# Patient Record
Sex: Male | Born: 1950 | Race: White | Marital: Single
Health system: Southern US, Community
[De-identification: ages and names within clinical notes are randomized; demographics above are authoritative.]

---

## 2021-10-15 ENCOUNTER — Encounter (INDEPENDENT_AMBULATORY_CARE_PROVIDER_SITE_OTHER): Payer: Self-pay

## 2021-10-16 NOTE — Telephone Encounter (Signed)
This encounter was created in error - please disregard.    Items noted as "reviewed" are for administrative purposes only and are not guaranteed by the provider to be accurate on this date.

## 2021-12-01 IMAGING — CT CT CHEST/ABD/PELVIS W CON
2 of 7 series · 11 of 46 positions shown, 12 images · non-contrast
Comparison: none

[Series 6: venous · axial · portal-venous · 0.62mm/px · z∈[-1616,-1037]mm · 8 of 233 slices shown, 9 images]
[im 20/233  soft-tissue]
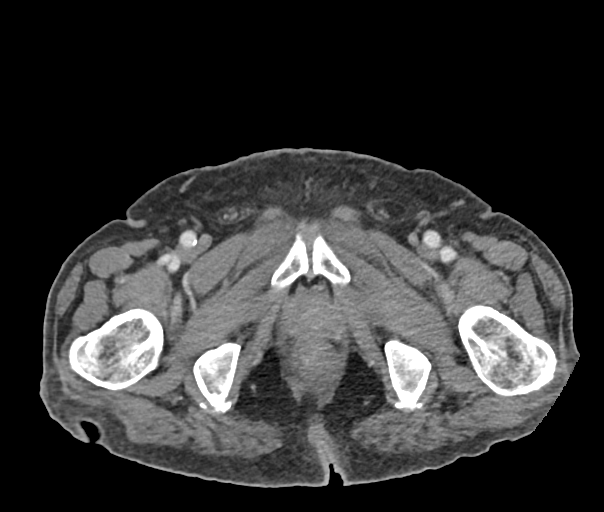
[im 20/233  bone]
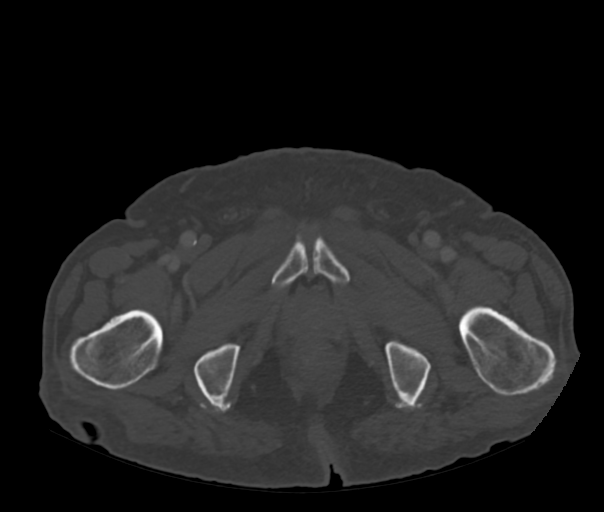
[im 59/233  soft-tissue]
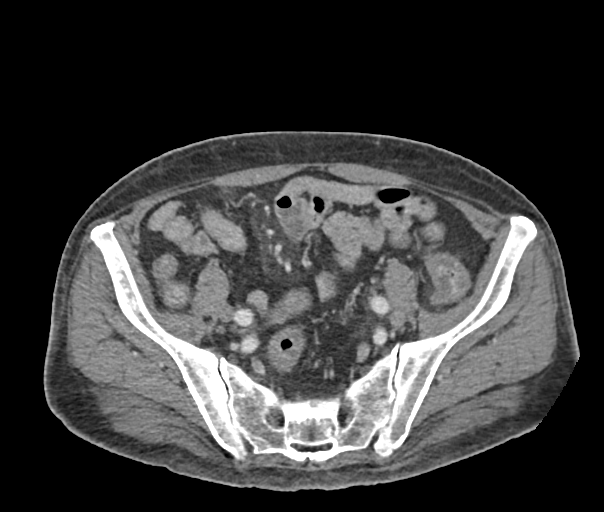
[im 78/233  soft-tissue]
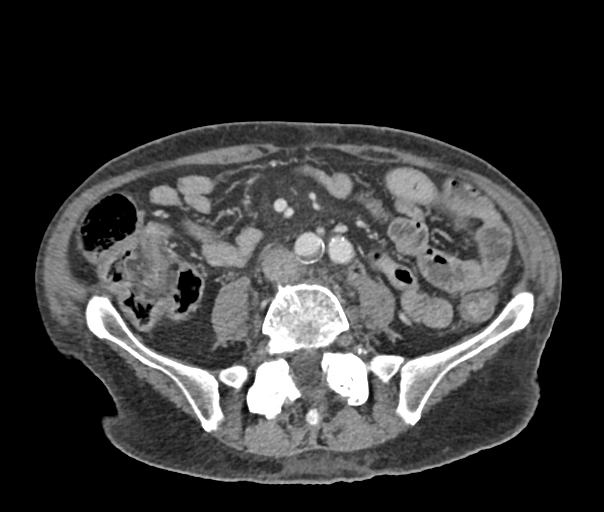
[im 97/233  soft-tissue]
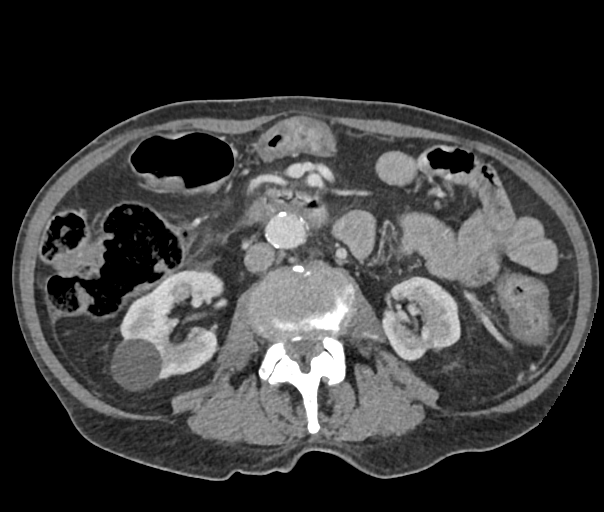
[im 136/233  soft-tissue]
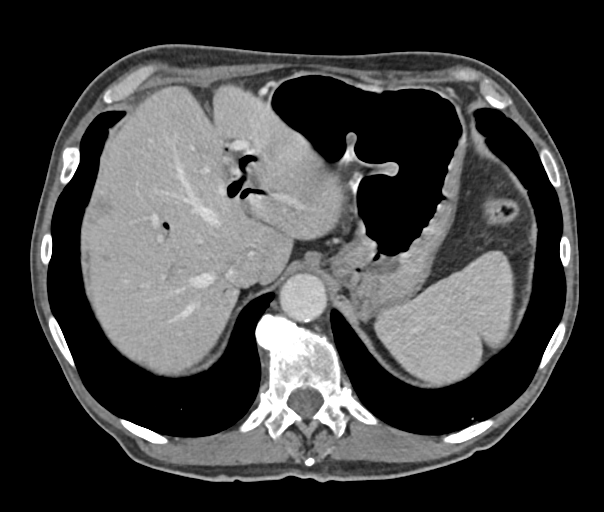
[im 155/233  soft-tissue]
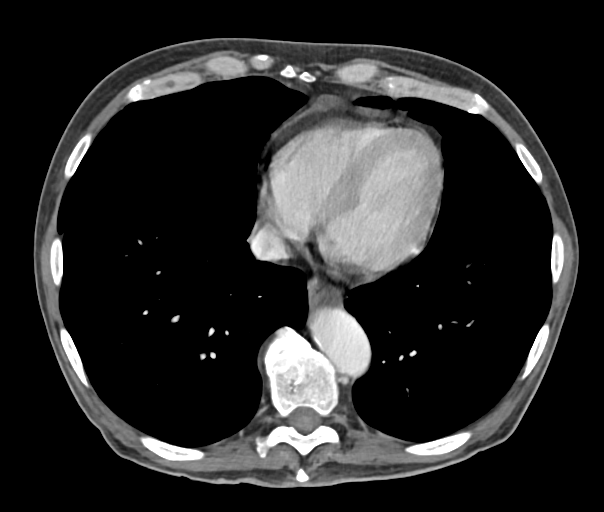
[im 175/233  soft-tissue]
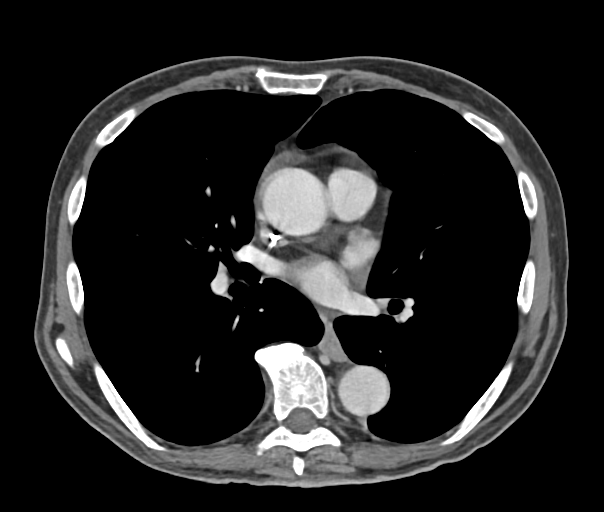
[im 213/233  soft-tissue]
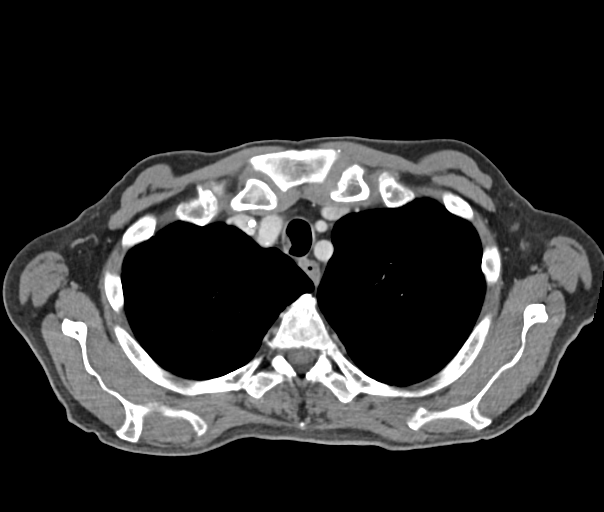

[Series 8: coronal · coronal · 0.73mm/px · 3 of 92 slices shown]
[im 31/92  soft-tissue]
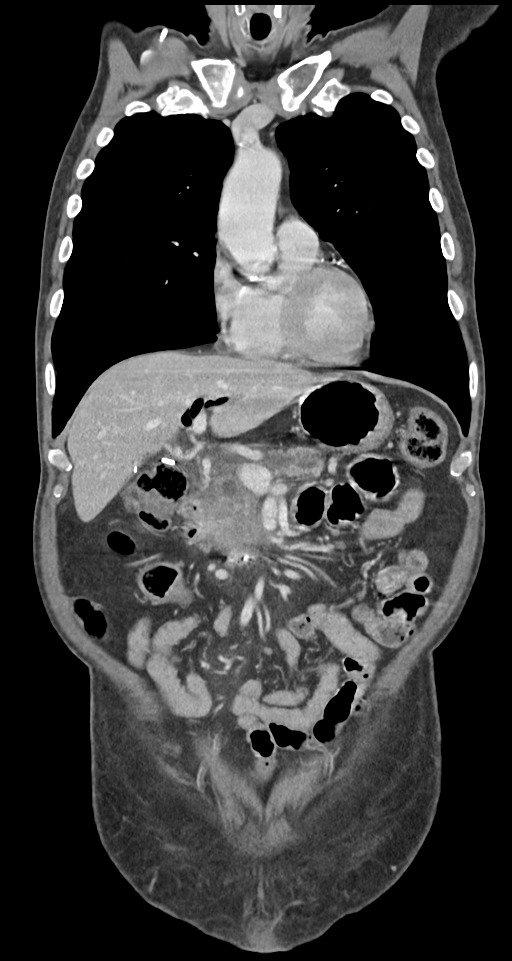
[im 41/92  soft-tissue]
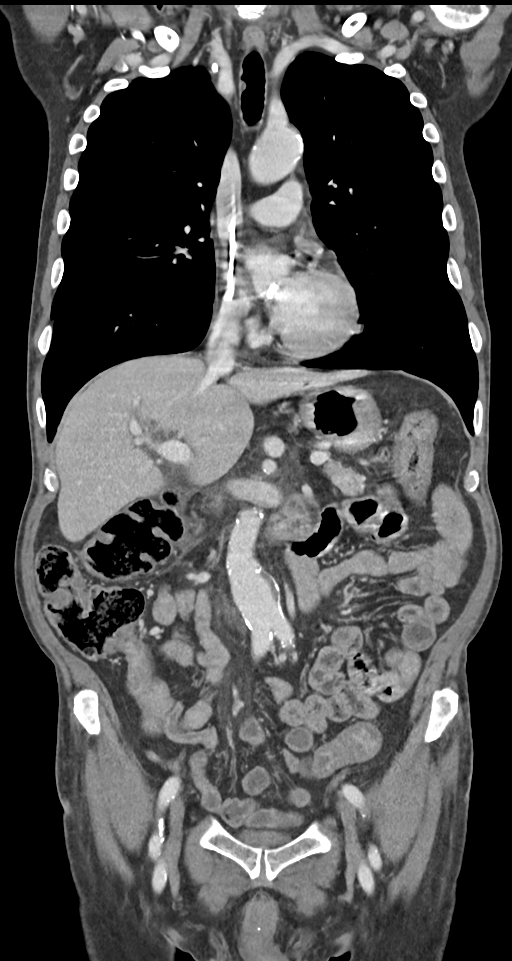
[im 51/92  soft-tissue]
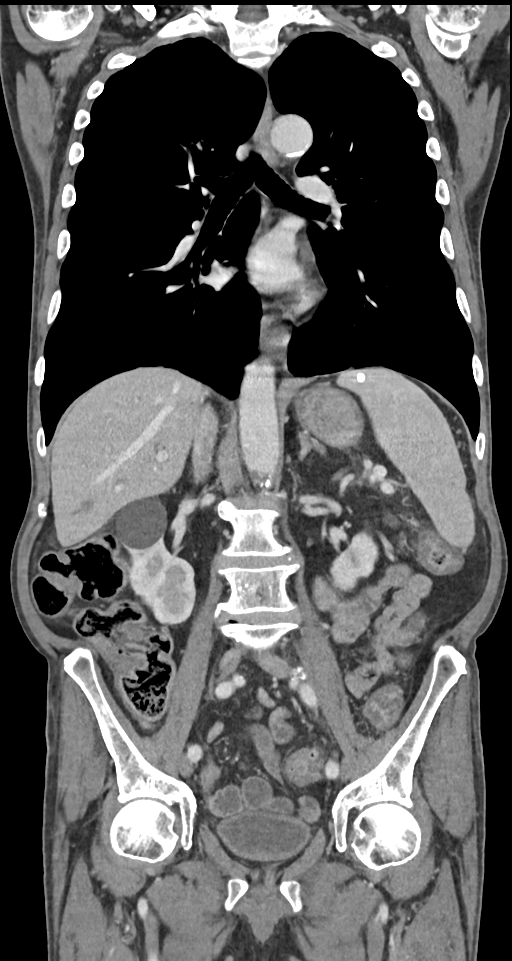

[11 of 46 positions shown; findings below may reference images not displayed]

REASON FOR EXAM

*
Malignant neoplasm of head of pancreas

COMPARISON

*
CT chest 10/29/2020

*
CT abdomen pelvis 10/24/2020

TECHNIQUE

*
CT images of the chest, abdomen, and pelvis were acquired after the administration of 100 mL Msovue-RYQ IV contrast

*
No oral contrast 

*
i-STAT Creatinine 0.8 mg/dL

*
Total radiation dose to patient is CTDIvol 28.32 mGy and DLP 922.10 mGy-cm.

FINDINGS

Mediastinum and Thoracic Inlet

*
Right chest wall Mediport

*
Mild, diffuse esophageal thickening may be related to therapy

*
No enlarged lymph nodes

*
Tortuous thoracic aorta

*
Amount of pericardial fluid is at the upper limits of normal

*
Coronary artery calcification

Lungs and Airways

*
Small amount of secretions within the trachea

*
Punctate calcified granulomas in both lungs

*
No suspicious nodule or mass

*
No consolidation or worrisome groundglass disease

Pleura

*
No effusion

*
No nodularity

Chest Wall and Superficial Tissues

*
Healing fracture of the left posterior ninth rib with mild expansion ([DATE])

*
Additional healing fractures of the right anterior fifth through ninth ribs are more typical of trauma

*
Spondylosis with DISH

Liver

*
Smooth margins

*
Minimal arterial phase shunting in segment V is not well-defined ([DATE])

*
Liver lesions are best seen in the portal venous phase

*
Parenchyma contains about 10 hypoenhancing lesions

*
In general, the lesions are better defined on today's exam, but some attempts at comparison measurements are made below

*
Disease is compared to the 10/24/2020 contrast enhanced CT abdomen pelvis exam

*
Segment VI lesion ([DATE])   0.9 cm   --->   1.0 cm

*
Segment VIII lesion ([DATE])   1.2 cm   --->   1.7 cm

Gallbladder and Bile Ducts

*
Gallbladder not visualized

*
Metallic common bile duct stent is appropriately positioned

*
Biliary dilatation has improved since 10/24/2020

*
Pneumobilia

Pancreas

*
Masslike area in the head measures about 3 x 4 cm ([DATE])

*
This process is too infiltrative to provide accurate measurement comparison to the prior exam

*
Mass is contiguous with the second/third portions of the duodenum

*
Main pancreatic duct does have an abrupt cut off at the mass

*
Modest improvement in dilatation of the main pancreatic duct ([DATE])   14 mm   --->   11 mm

Adrenals, Spleen, Kidneys, Urinary Bladder, Reproductive

*
Calcified granuloma in the spleen

*
Bosniak 2 cyst in the left kidney ([DATE]) 4.5 x 3.5 cm

*
Additional, less complex renal cysts

*
No hydronephrosis

*
Enlarged central prostate gland

Gastrointestinal

*
No bowel obstruction

Peritoneum and Retroperitoneum

*
Trace ascites

*
No visible peritoneal implants

*
No enlarged lymph nodes

*
Stable small aneurysm of the infrarenal abdominal aorta

*
Portal vein and its branches remain patent

Bones and Superficial Tissues

*
No destructive lesion in the lumbar spine or pelvic bones

IMPRESSION

*
Infiltrative pancreatic head mass is difficult to measure on CT

*
Hepatic metastases are more conspicuous on today's exam (when compared to 10/24/2020). Suspect this is due to increased conspicuity with therapy (rather than progressive disease)

*
No lymphadenopathy

*
Healing left ninth rib fracture. CT appearance is indeterminate as to whether this is posttraumatic or pathologic

## 2022-05-29 DEATH — deceased
